# Patient Record
Sex: Male | Born: 1965 | Race: Black or African American | Hispanic: No | Marital: Single | Smoking: Current some day smoker
Health system: Southern US, Community
[De-identification: ages and names within clinical notes are randomized; demographics above are authoritative.]

## PROBLEM LIST (undated history)

## (undated) DIAGNOSIS — I10 Essential (primary) hypertension: Secondary | ICD-10-CM

---

## 1999-10-05 HISTORY — PX: KIDNEY DONATION: SHX685

## 2017-02-14 ENCOUNTER — Emergency Department (HOSPITAL_BASED_OUTPATIENT_CLINIC_OR_DEPARTMENT_OTHER): Payer: 59

## 2017-02-14 ENCOUNTER — Encounter (HOSPITAL_BASED_OUTPATIENT_CLINIC_OR_DEPARTMENT_OTHER): Payer: Self-pay | Admitting: Emergency Medicine

## 2017-02-14 ENCOUNTER — Emergency Department (HOSPITAL_BASED_OUTPATIENT_CLINIC_OR_DEPARTMENT_OTHER)
Admission: EM | Admit: 2017-02-14 | Discharge: 2017-02-14 | Disposition: A | Discharge status: Home / Self Care | Payer: 59 | Attending: Emergency Medicine | Admitting: Emergency Medicine

## 2017-02-14 DIAGNOSIS — F17290 Nicotine dependence, other tobacco product, uncomplicated: Secondary | ICD-10-CM | POA: Insufficient documentation

## 2017-02-14 DIAGNOSIS — Y929 Unspecified place or not applicable: Secondary | ICD-10-CM | POA: Insufficient documentation

## 2017-02-14 DIAGNOSIS — Y999 Unspecified external cause status: Secondary | ICD-10-CM | POA: Diagnosis not present

## 2017-02-14 DIAGNOSIS — S6991XA Unspecified injury of right wrist, hand and finger(s), initial encounter: Secondary | ICD-10-CM | POA: Diagnosis present

## 2017-02-14 DIAGNOSIS — S62656A Nondisplaced fracture of medial phalanx of right little finger, initial encounter for closed fracture: Secondary | ICD-10-CM | POA: Insufficient documentation

## 2017-02-14 DIAGNOSIS — Y939 Activity, unspecified: Secondary | ICD-10-CM | POA: Insufficient documentation

## 2017-02-14 DIAGNOSIS — W231XXA Caught, crushed, jammed, or pinched between stationary objects, initial encounter: Secondary | ICD-10-CM | POA: Insufficient documentation

## 2017-02-14 NOTE — ED Notes (Signed)
Patient states that he fell yesterday and hurt his right pinky finger. The patient has noted swelling to his 2nd and third joints.

## 2017-02-14 NOTE — ED Provider Notes (Signed)
[facility]   CSN: [id number] Arrival date & time: 02/14/17  1820  By signing my name below, I, Andres Nguyen, attest that this documentation has been prepared under the direction and in the presence of physician practitioner, Benjiman Core, MD. Electronically Signed: Linna Nguyen, Scribe. 02/14/2017. 6:39 PM.  History   Chief Complaint Chief Complaint  Patient presents with  . Finger Injury   The history is provided by the patient. No language interpreter was used.    HPI Comments: Andres Nguyen is a 51 y.o. male who presents to the Emergency Department complaining of a right pinky finger injury sustained yesterday. He suffered a mechanical fall yesterday and "jammed" his right pinky finger on the ground. No head trauma or LOC. Patient reports significant pain and swelling to his right pinky finger. No medications or treatments tried. No h/o injuries to his right pinky finger or right hand. He denies numbness/tingling, color change, wounds, or any other associated symptoms.  History reviewed. No pertinent past medical history.  There are no active problems to display for this patient.   History reviewed. No pertinent surgical history.     Home Medications    Prior to Admission medications   Not on File    Family History History reviewed. No pertinent family history.  Social History Social History  Substance Use Topics  . Smoking status: Current Some Day Smoker    Types: Cigars  . Smokeless tobacco: Never Used  . Alcohol use No     Allergies   Patient has no known allergies.   Review of Systems Review of Systems  Musculoskeletal: Positive for arthralgias and joint swelling.  Skin: Negative for color change and wound.  Neurological: Negative for syncope and numbness.   Physical Exam Updated Vital Signs BP (!) 157/90 (BP Location: Left Arm)   Pulse 67   Temp 98.4 F (36.9 C) (Oral)   Resp 18   Ht 6' (1.829 m)   Wt 175 lb  (79.4 kg)   SpO2 99%   BMI 23.73 kg/m   Physical Exam  Constitutional: He is oriented to person, place, and time. He appears well-developed and well-nourished. No distress.  HENT:  Head: Normocephalic and atraumatic.  Neck: Neck supple. No tracheal deviation present.  Pulmonary/Chest: No respiratory distress.  Musculoskeletal: Normal range of motion. He exhibits tenderness.  Right hand, 5th digit: Tenderness and swelling over the PIP joint. No tenderness over DIP or MCP joints. Good movement of MCP and DIP joints. Skin is intact.  Neurological: He is alert and oriented to person, place, and time.  Psychiatric: He has a normal mood and affect. His behavior is normal.  Nursing note and vitals reviewed.  ED Treatments / Results  Labs (all labs ordered are listed, but only abnormal results are displayed) Labs Reviewed - No data to display  EKG  EKG Interpretation None       Radiology Dg Finger Little Right  Result Date: 02/14/2017 CLINICAL DATA:  Fall with pain and injury to the PIP joint EXAM: RIGHT LITTLE FINGER 2+V COMPARISON:  None. FINDINGS: No subluxation. Mild degenerative changes at the DIP and PIP joints. Soft tissue swelling at the PIP joint. Questionable lucency along the volar aspect of the base of the fifth middle phalanx. IMPRESSION: Possible linear lucency/fracture along the volar aspect of the base of the fifth middle phalanx. Electronically Signed   By: Jasmine Pang M.D.   On: 02/14/2017 18:49    Procedures Procedures (including critical care  time)  DIAGNOSTIC STUDIES: Oxygen Saturation is 100% on RA, normal by my interpretation.    COORDINATION OF CARE: 6:36 PM Discussed treatment plan with pt at bedside and pt agreed to plan.  Medications Ordered in ED Medications - No data to display   Initial Impression / Assessment and Plan / ED Course  I have reviewed the triage vital signs and the nursing notes.  Pertinent labs & imaging results that were  available during my care of the patient were reviewed by me and considered in my medical decision making (see chart for details).     Patient with finger injury. Small fracture. Splinted. Discharge to follow-up with sports medicine.  Final Clinical Impressions(s) / ED Diagnoses   Final diagnoses:  Closed nondisplaced fracture of middle phalanx of right little finger, initial encounter    New Prescriptions There are no discharge medications for this patient.  I personally performed the services described in this documentation, which was scribed in my presence. The recorded information has been reviewed and is accurate.      Benjiman CorePickering, Dominque Marlin, MD 02/14/17 2340

## 2020-08-30 ENCOUNTER — Emergency Department (HOSPITAL_BASED_OUTPATIENT_CLINIC_OR_DEPARTMENT_OTHER)
Admission: EM | Admit: 2020-08-30 | Discharge: 2020-08-30 | Disposition: A | Discharge status: Home / Self Care | Payer: 59 | Attending: Emergency Medicine | Admitting: Emergency Medicine

## 2020-08-30 ENCOUNTER — Other Ambulatory Visit: Payer: Self-pay

## 2020-08-30 ENCOUNTER — Encounter (HOSPITAL_BASED_OUTPATIENT_CLINIC_OR_DEPARTMENT_OTHER): Payer: Self-pay

## 2020-08-30 DIAGNOSIS — L02414 Cutaneous abscess of left upper limb: Secondary | ICD-10-CM | POA: Insufficient documentation

## 2020-08-30 DIAGNOSIS — I10 Essential (primary) hypertension: Secondary | ICD-10-CM | POA: Diagnosis not present

## 2020-08-30 DIAGNOSIS — F17290 Nicotine dependence, other tobacco product, uncomplicated: Secondary | ICD-10-CM | POA: Diagnosis not present

## 2020-08-30 DIAGNOSIS — L0291 Cutaneous abscess, unspecified: Secondary | ICD-10-CM

## 2020-08-30 DIAGNOSIS — R2232 Localized swelling, mass and lump, left upper limb: Secondary | ICD-10-CM | POA: Diagnosis present

## 2020-08-30 HISTORY — DX: Essential (primary) hypertension: I10

## 2020-08-30 MED ORDER — SULFAMETHOXAZOLE-TRIMETHOPRIM 800-160 MG PO TABS: 1.0000 | ORAL_TABLET | Freq: Two times a day (BID) | ORAL | 0 refills | Status: AC

## 2020-08-30 MED ORDER — SULFAMETHOXAZOLE-TRIMETHOPRIM 800-160 MG PO TABS
1.0000 | ORAL_TABLET | Freq: Once | ORAL | Status: AC
  Administered 2020-08-30: 14:00:00 1 via ORAL
  Filled 2020-08-30: qty 1

## 2020-08-30 MED ORDER — LIDOCAINE-EPINEPHRINE (PF) 2 %-1:200000 IJ SOLN
10.0000 mL | Freq: Once | INTRAMUSCULAR | Status: AC
  Administered 2020-08-30: 10 mL
  Filled 2020-08-30: qty 20

## 2020-08-30 NOTE — ED Provider Notes (Signed)
[facility] EMERGENCY DEPARTMENT Provider Note   CSN: [id number] Arrival date & time: 08/30/20  1201     History Chief Complaint  Patient presents with   Wound Check    Andres Nguyen is a 54 y.o. male.  Pt presents to the ED today with a wound to his left forearm.  He said he fell outside on Monday, 11/22 and scraped his left arm on the cement.  He said his wound is getting more swollen and red.        Past Medical History:  Diagnosis Date   Hypertension     There are no problems to display for this patient.   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 No pertinent family history.  Social History   Tobacco Use   Smoking status: Current Some Day Smoker    Types: Cigars   Smokeless tobacco: Never Used  Substance Use Topics   Alcohol use: Yes    Comment: ocassional   Drug use: Yes    Types: Marijuana    Home Medications Prior to Admission medications   Medication Sig Start Date End Date Taking? Authorizing Provider  sulfamethoxazole-trimethoprim (BACTRIM DS) 800-160 MG tablet Take 1 tablet by mouth 2 (two) times daily for 7 days. 08/30/20 09/06/20  Jacalyn Lefevre, MD    Allergies    Patient has no known allergies.  Review of Systems   Review of Systems  Skin: Positive for wound.  All other systems reviewed and are negative.   Physical Exam Updated Vital Signs BP (!) 156/86 (BP Location: Right Arm)    Pulse 75    Temp 98.7 F (37.1 C) (Oral)    Resp 18    Ht 6' 6.5" (1.994 m)    Wt 74.8 kg    SpO2 99%    BMI 18.83 kg/m   Physical Exam Vitals and nursing note reviewed.  Constitutional:      Appearance: Normal appearance.  HENT:     Head: Normocephalic and atraumatic.     Right Ear: External ear normal.     Left Ear: External ear normal.     Nose: Nose normal.     Mouth/Throat:     Mouth: Mucous membranes are moist.      Pharynx: Oropharynx is clear.  Eyes:     Extraocular Movements: Extraocular movements intact.     Conjunctiva/sclera: Conjunctivae normal.     Pupils: Pupils are equal, round, and reactive to light.  Cardiovascular:     Rate and Rhythm: Normal rate and regular rhythm.     Pulses: Normal pulses.     Heart sounds: Normal heart sounds.  Pulmonary:     Effort: Pulmonary effort is normal.     Breath sounds: Normal breath sounds.  Abdominal:     General: Abdomen is flat. Bowel sounds are normal.     Palpations: Abdomen is soft.  Musculoskeletal:        General: Normal range of motion.     Cervical back: Normal range of motion and neck supple.  Skin:    General: Skin is warm.     Capillary Refill: Capillary refill takes less than 2 seconds.     Comments: Quarter sized necrotic area with abscess to left forearm  Neurological:     General: No focal deficit present.     Mental Status: He is alert and oriented  to person, place, and time.  Psychiatric:        Mood and Affect: Mood normal.        Behavior: Behavior normal.     ED Results / Procedures / Treatments   Labs (all labs ordered are listed, but only abnormal results are displayed) Labs Reviewed - No data to display  EKG None  Radiology No results found.  Procedures .Marland KitchenIncision and Drainage  Date/Time: 08/30/2020 1:16 PM Performed by: Jacalyn Lefevre, MD Authorized by: Jacalyn Lefevre, MD   Consent:    Consent obtained:  Verbal   Consent given by:  Patient   Risks discussed:  Bleeding, incomplete drainage and pain   Alternatives discussed:  No treatment Location:    Type:  Abscess   Size:  3   Location:  Upper extremity   Upper extremity location:  Arm   Arm location:  L lower arm Pre-procedure details:    Skin preparation:  Betadine Anesthesia (see MAR for exact dosages):    Anesthesia method:  Local infiltration   Local anesthetic:  Lidocaine 2% WITH epi Procedure type:    Complexity:  Simple Procedure  details:    Incision types:  Single straight   Scalpel blade:  11   Wound management:  Probed and deloculated   Drainage:  Purulent   Drainage amount:  Copious   Wound treatment:  Wound left open   Packing materials:  None Post-procedure details:    Patient tolerance of procedure:  Tolerated well, no immediate complications Comments:     Necrotic area opened up and deroofed.  Area under necrotic area probed and deloculated.   (including critical care time)  Medications Ordered in ED Medications  sulfamethoxazole-trimethoprim (BACTRIM DS) 800-160 MG per tablet 1 tablet (has no administration in time range)  lidocaine-EPINEPHrine (XYLOCAINE W/EPI) 2 %-1:200000 (PF) injection 10 mL (10 mLs Infiltration Given by Other 08/30/20 1254)    ED Course  I have reviewed the triage vital signs and the nursing notes.  Pertinent labs & imaging results that were available during my care of the patient were reviewed by me and considered in my medical decision making (see chart for details).    MDM Rules/Calculators/A&P                          Pt will be started on bactrim.  He is to return if worse.  F/u with pcp. Final Clinical Impression(s) / ED Diagnoses Final diagnoses:  Abscess    Rx / DC Orders ED Discharge Orders         Ordered    sulfamethoxazole-trimethoprim (BACTRIM DS) 800-160 MG tablet  2 times daily        08/30/20 1314           Jacalyn Lefevre, MD 08/30/20 1318

## 2020-08-30 NOTE — ED Triage Notes (Signed)
Pt arrives stating he fell outside on Monday and cut his left elbow on the cement. States over the past few days he has had discharge, swelling and redness to elbow. States he has been feeling hot and cold.

## 2021-01-21 ENCOUNTER — Emergency Department (HOSPITAL_BASED_OUTPATIENT_CLINIC_OR_DEPARTMENT_OTHER)
Admission: EM | Admit: 2021-01-21 | Discharge: 2021-01-21 | Disposition: A | Discharge status: Home / Self Care | Payer: 59 | Attending: Emergency Medicine | Admitting: Emergency Medicine

## 2021-01-21 ENCOUNTER — Emergency Department (HOSPITAL_BASED_OUTPATIENT_CLINIC_OR_DEPARTMENT_OTHER): Payer: 59

## 2021-01-21 ENCOUNTER — Other Ambulatory Visit: Payer: Self-pay

## 2021-01-21 ENCOUNTER — Encounter (HOSPITAL_BASED_OUTPATIENT_CLINIC_OR_DEPARTMENT_OTHER): Payer: Self-pay

## 2021-01-21 DIAGNOSIS — I10 Essential (primary) hypertension: Secondary | ICD-10-CM | POA: Insufficient documentation

## 2021-01-21 DIAGNOSIS — S3992XA Unspecified injury of lower back, initial encounter: Secondary | ICD-10-CM | POA: Diagnosis present

## 2021-01-21 DIAGNOSIS — X500XXA Overexertion from strenuous movement or load, initial encounter: Secondary | ICD-10-CM | POA: Insufficient documentation

## 2021-01-21 DIAGNOSIS — Y9389 Activity, other specified: Secondary | ICD-10-CM | POA: Insufficient documentation

## 2021-01-21 DIAGNOSIS — S39012A Strain of muscle, fascia and tendon of lower back, initial encounter: Secondary | ICD-10-CM | POA: Insufficient documentation

## 2021-01-21 DIAGNOSIS — F17290 Nicotine dependence, other tobacco product, uncomplicated: Secondary | ICD-10-CM | POA: Insufficient documentation

## 2021-01-21 MED ORDER — CYCLOBENZAPRINE HCL 10 MG PO TABS: 10.0000 mg | ORAL_TABLET | Freq: Two times a day (BID) | ORAL | 0 refills | Status: AC | PRN

## 2021-01-21 NOTE — ED Provider Notes (Signed)
[facility] EMERGENCY DEPARTMENT Provider Note   CSN: [id number] Arrival date & time: 01/21/21  0175     History Chief Complaint  Patient presents with  . Back Injury    Andres Nguyen is a 55 y.o. male.  Presents to ER with concern for back pain.  Patient reports history while he was moving furniture, corner hit him in his lower back.  Noted slight area of swelling.  Has been able to walk without difficulty.  Denies any other injuries.  Pain is currently mild to moderate.  Central lower.  Nonradiating.  No numbness or weakness.  HPI     Past Medical History:  Diagnosis Date  . Hypertension     There are no problems to display for this patient.   Past Surgical History:  Procedure Laterality Date  . 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 No pertinent family history.  Social History   Tobacco Use  . Smoking status: Current Some Day Smoker    Types: Cigars  . Smokeless tobacco: Never Used  Substance Use Topics  . Alcohol use: Yes    Comment: ocassional  . Drug use: Yes    Types: Marijuana    Home Medications Prior to Admission medications   Medication Sig Start Date End Date Taking? Authorizing Provider  cyclobenzaprine (FLEXERIL) 10 MG tablet Take 1 tablet (10 mg total) by mouth 2 (two) times daily as needed for muscle spasms. 01/21/21  Yes Milagros Loll, MD    Allergies    Patient has no known allergies.  Review of Systems   Review of Systems  Constitutional: Negative for chills and fever.  HENT: Negative for ear pain and sore throat.   Eyes: Negative for pain and visual disturbance.  Respiratory: Negative for cough and shortness of breath.   Cardiovascular: Negative for chest pain and palpitations.  Gastrointestinal: Negative for abdominal pain and vomiting.  Genitourinary: Negative for dysuria and hematuria.  Musculoskeletal: Positive for arthralgias and back pain.  Skin: Negative for  color change and rash.  Neurological: Negative for seizures and syncope.  All other systems reviewed and are negative.   Physical Exam Updated Vital Signs BP (!) 157/95 (BP Location: Right Arm)   Pulse (!) 57   Temp 98 F (36.7 C) (Oral)   Resp 18   Ht 6\' 6"  (1.981 m)   Wt 74.8 kg   SpO2 99%   BMI 19.07 kg/m   Physical Exam Vitals and nursing note reviewed.  Constitutional:      Appearance: He is well-developed.  HENT:     Head: Normocephalic and atraumatic.  Eyes:     Conjunctiva/sclera: Conjunctivae normal.  Cardiovascular:     Rate and Rhythm: Normal rate and regular rhythm.     Heart sounds: No murmur heard.   Pulmonary:     Effort: Pulmonary effort is normal.  Abdominal:     Palpations: Abdomen is soft.     Tenderness: There is no abdominal tenderness.  Musculoskeletal:     Cervical back: Neck supple.     Comments: Some tenderness noted over the lumbar region, no deformity noted, no step-off  Skin:    General: Skin is warm and dry.  Neurological:     General: No focal deficit present.     Mental Status: He is alert.  Psychiatric:        Mood and Affect: Mood normal.     ED  Results / Procedures / Treatments   Labs (all labs ordered are listed, but only abnormal results are displayed) Labs Reviewed - No data to display  EKG None  Radiology DG Lumbar Spine Complete  Result Date: 01/21/2021 CLINICAL DATA:  55 year old male with history of back pain after a fall. EXAM: LUMBAR SPINE - COMPLETE 4+ VIEW COMPARISON:  No priors. FINDINGS: Five views of the lumbar spine demonstrate no acute displaced fracture. Alignment is anatomic. No defects of the pars interarticularis are noted. Disc space narrowing at L5-S1 with discogenic changes in the adjacent vertebral body endplates. Multilevel facet arthropathy, most pronounced at L5-S1. IMPRESSION: 1. No acute radiographic abnormality of the lumbar spine. 2. Multilevel degenerative disc disease and lumbar spondylosis,  most pronounced at L5-S1. Electronically Signed   By: Trudie Reed M.D.   On: 01/21/2021 07:57    Procedures Procedures   Medications Ordered in ED Medications - No data to display  ED Course  I have reviewed the triage vital signs and the nursing notes.  Pertinent labs & imaging results that were available during my care of the patient were reviewed by me and considered in my medical decision making (see chart for details).    MDM Rules/Calculators/A&P                          55 year old male presents to ER with concern for low back pain after injury.  X-ray negative.  He is well-appearing, no other trauma identified on exam.  Discharged home.   After the discussed management above, the patient was determined to be safe for discharge.  The patient was in agreement with this plan and all questions regarding their care were answered.  ED return precautions were discussed and the patient will return to the ED with any significant worsening of condition.   Final Clinical Impression(s) / ED Diagnoses Final diagnoses:  Strain of lumbar region, initial encounter    Rx / DC Orders ED Discharge Orders         Ordered    cyclobenzaprine (FLEXERIL) 10 MG tablet  2 times daily PRN        01/21/21 0817           Milagros Loll, MD 01/21/21 1041

## 2021-01-21 NOTE — Discharge Instructions (Signed)
Take Tylenol or Motrin for pain control.  Can also take the prescribed muscle relaxer as needed.  Note this can make you drowsy and should not be taken while driving or operating heavy machinery.  Follow-up with your primary doctor.

## 2021-01-21 NOTE — ED Triage Notes (Signed)
Pt states approx 9pm last night he hit the corner of the wall on his lower back and a knot appeared. States the swelling has gone down but the area is still tender. Ambulatory to room. No acute distress noted.

## 2021-06-28 ENCOUNTER — Emergency Department (HOSPITAL_BASED_OUTPATIENT_CLINIC_OR_DEPARTMENT_OTHER): Payer: BC Managed Care – PPO

## 2021-06-28 ENCOUNTER — Other Ambulatory Visit: Payer: Self-pay

## 2021-06-28 ENCOUNTER — Emergency Department (HOSPITAL_BASED_OUTPATIENT_CLINIC_OR_DEPARTMENT_OTHER)
Admission: EM | Admit: 2021-06-28 | Discharge: 2021-06-28 | Disposition: A | Discharge status: Home / Self Care | Payer: BC Managed Care – PPO | Attending: Emergency Medicine | Admitting: Emergency Medicine

## 2021-06-28 ENCOUNTER — Encounter (HOSPITAL_BASED_OUTPATIENT_CLINIC_OR_DEPARTMENT_OTHER): Payer: Self-pay | Admitting: Emergency Medicine

## 2021-06-28 DIAGNOSIS — R7309 Other abnormal glucose: Secondary | ICD-10-CM | POA: Insufficient documentation

## 2021-06-28 DIAGNOSIS — L03012 Cellulitis of left finger: Secondary | ICD-10-CM | POA: Diagnosis not present

## 2021-06-28 DIAGNOSIS — I10 Essential (primary) hypertension: Secondary | ICD-10-CM | POA: Insufficient documentation

## 2021-06-28 DIAGNOSIS — M79645 Pain in left finger(s): Secondary | ICD-10-CM | POA: Diagnosis present

## 2021-06-28 DIAGNOSIS — F17290 Nicotine dependence, other tobacco product, uncomplicated: Secondary | ICD-10-CM | POA: Diagnosis not present

## 2021-06-28 LAB — CBG MONITORING, ED: Glucose-Capillary: 103 mg/dL — ABNORMAL HIGH (ref 70–99)

## 2021-06-28 MED ORDER — LIDOCAINE HCL 2 % IJ SOLN: 10.0000 mL | Freq: Once | INTRAMUSCULAR | Status: DC

## 2021-06-28 MED ORDER — DOXYCYCLINE HYCLATE 100 MG PO TABS
100.0000 mg | ORAL_TABLET | Freq: Once | ORAL | Status: AC
  Administered 2021-06-28: 100 mg via ORAL
  Filled 2021-06-28: qty 1

## 2021-06-28 MED ORDER — BUPIVACAINE HCL (PF) 0.5 % IJ SOLN
10.0000 mL | Freq: Once | INTRAMUSCULAR | Status: AC
  Administered 2021-06-28: 10 mL
  Filled 2021-06-28: qty 10

## 2021-06-28 MED ORDER — DOXYCYCLINE HYCLATE 100 MG PO CAPS: 100.0000 mg | ORAL_CAPSULE | Freq: Two times a day (BID) | ORAL | 0 refills | Status: AC

## 2021-06-28 NOTE — Discharge Instructions (Addendum)
I prescribed you an antibiotic called doxycycline.  Please take this twice a day for the next 10 days.  Do not stop taking this medication early even if you feel your symptoms have resolved.  Below is the contact information for Dr. Aundria Rud.  He is a Haematologist.  Please give them a call and schedule a follow-up appointment for your hand.  If you cannot get an appointment soon with him please return to the emergency department in 48 hours for a recheck of your finger.  If you develop any new or worsening symptoms please come back to the emergency department immediately.  It was a pleasure to meet you.

## 2021-06-28 NOTE — ED Triage Notes (Signed)
Reports injuring the left hand while moving some furniture.  Happened a few days ago.

## 2021-06-28 NOTE — ED Provider Notes (Signed)
[facility] EMERGENCY DEPARTMENT Provider Note   CSN: [id number] Arrival date & time: 06/28/21  2114     History Chief Complaint  Patient presents with   Hand Injury    Andres Nguyen is a 55 y.o. male.  HPI Patient is a 55 year old right-hand-dominant male who presents to the emergency department due to left third digit pain and swelling.  Patient states that he struck the end of the finger on a piece of furniture while moving it a few days ago.  He is continued to develop pain and swelling along the distal tip of the digit circumferentially.  Denies any nausea, vomiting, fevers, chills.    Past Medical History:  Diagnosis Date   Hypertension     There are no problems to display for this patient.   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  Social History   Tobacco Use   Smoking status: Some Days    Types: Cigars   Smokeless tobacco: Never  Substance Use Topics   Alcohol use: Yes    Comment: ocassional   Drug use: Yes    Types: Marijuana    Home Medications Prior to Admission medications   Medication Sig Start Date End Date Taking? Authorizing Provider  doxycycline (VIBRAMYCIN) 100 MG capsule Take 1 capsule (100 mg total) by mouth 2 (two) times daily for 10 days. 06/28/21 07/08/21 Yes Placido Sou, PA-C  cyclobenzaprine (FLEXERIL) 10 MG tablet Take 1 tablet (10 mg total) by mouth 2 (two) times daily as needed for muscle spasms. 01/21/21   Milagros Loll, MD    Allergies    Patient has no known allergies.  Review of Systems   Review of Systems  Constitutional:  Negative for chills and fever.  Gastrointestinal:  Negative for nausea and vomiting.  Musculoskeletal:  Positive for arthralgias and joint swelling.  Skin:  Negative for wound.   Physical Exam Updated Vital Signs BP (!) 144/91 (BP Location: Right Arm)   Pulse 61   Temp  98.1 F (36.7 C) (Oral)   Resp 16   Ht 6' 6.5" (1.994 m)   Wt 72.1 kg   SpO2 98%   BMI 18.14 kg/m   Physical Exam Vitals and nursing note reviewed.  Constitutional:      General: He is not in acute distress.    Appearance: He is well-developed.  HENT:     Head: Normocephalic and atraumatic.     Right Ear: External ear normal.     Left Ear: External ear normal.  Eyes:     General: No scleral icterus.       Right eye: No discharge.        Left eye: No discharge.     Conjunctiva/sclera: Conjunctivae normal.  Neck:     Trachea: No tracheal deviation.  Cardiovascular:     Rate and Rhythm: Normal rate.  Pulmonary:     Effort: Pulmonary effort is normal. No respiratory distress.     Breath sounds: No stridor.  Abdominal:     General: There is no distension.  Musculoskeletal:        General: Swelling and tenderness present. No deformity.     Cervical back: Neck supple.     Comments: Moderate TTP and edema noted circumferentially along the distal tip of the left third finger.  Mild tenderness along the palmar aspect of  the MCP of the same finger.  Unable to assess range of motion due to patient's pain.  Distal sensation intact.  2+ radial pulses.  Skin:    General: Skin is warm and dry.     Findings: No rash.  Neurological:     Mental Status: He is alert.     Cranial Nerves: Cranial nerve deficit: no gross deficits.   ED Results / Procedures / Treatments   Labs (all labs ordered are listed, but only abnormal results are displayed) Labs Reviewed  CBG MONITORING, ED - Abnormal; Notable for the following components:      Result Value   Glucose-Capillary 103 (*)    All other components within normal limits    EKG None  Radiology DG Hand Complete Left  Result Date: 06/28/2021 CLINICAL DATA:  left middle finger pain and swelling; possible infection. Reports injuring the left hand while moving some furniture. Happened a few days ago. Per PA, no report of index finger  injury. EXAM: LEFT HAND - COMPLETE 3+ VIEW COMPARISON:  X-ray left thumb 09/29/2018, left hand 02/24/2018 FINDINGS: There is no evidence of fracture or dislocation. No cortical erosion or destruction. Cortical irregularity of the fourth digit tuft likely representing an old healed fracture that is new from 2019. Degenerative changes of the first carpometacarpal joint. Mild subcutaneus soft tissue edema of the distal third digit. No retained radiopaque foreign body. IMPRESSION: 1. Mild subcutaneus soft tissue edema of the distal third digit with no radiographic findings to suggest osteomyelitis. 2.  No acute displaced fracture or dislocation. Electronically Signed   By: Tish Frederickson M.D.   On: 06/28/2021 22:28    Procedures Drain paronychia  Date/Time: 06/28/2021 11:49 PM Performed by: Placido Sou, PA-C Authorized by: Placido Sou, PA-C  Consent: Verbal consent obtained. Written consent obtained. Consent given by: patient Patient understanding: patient states understanding of the procedure being performed Patient consent: the patient's understanding of the procedure matches consent given Procedure consent: procedure consent matches procedure scheduled Relevant documents: relevant documents present and verified Test results: test results available and properly labeled Site marked: the operative site was marked Imaging studies: imaging studies available Required items: required blood products, implants, devices, and special equipment available Patient identity confirmed: verbally with patient Time out: Immediately prior to procedure a "time out" was called to verify the correct patient, procedure, equipment, support staff and site/side marked as required. Preparation: Patient was prepped and draped in the usual sterile fashion. Local anesthesia used: yes Anesthesia: digital block  Anesthesia: Local anesthesia used: yes Local Anesthetic: bupivacaine 0.5% without  epinephrine Anesthetic total: 3 mL  Sedation: Patient sedated: no  Patient tolerance: patient tolerated the procedure well with no immediate complications    Medications Ordered in ED Medications  bupivacaine (MARCAINE) 0.5 % injection 10 mL (10 mLs Infiltration Given by Other 06/28/21 2323)  doxycycline (VIBRA-TABS) tablet 100 mg (100 mg Oral Given 06/28/21 2332)    ED Course  I have reviewed the triage vital signs and the nursing notes.  Pertinent labs & imaging results that were available during my care of the patient were reviewed by me and considered in my medical decision making (see chart for details).    MDM Rules/Calculators/A&P                          Pt is a 55 y.o. male who presents to the emergency department with pain and swelling along the distal aspect of the left  middle finger after it was crushed between 2 pieces of furniture.  Labs: CBG of 103.  Imaging: X-ray of the left hand shows mild subcutaneous soft tissue edema of the distal third digit with no radiographic findings to suggest osteomyelitis.  No acute displaced fracture or dislocation.  I, Placido Sou, PA-C, personally reviewed and evaluated these images and lab results as part of my medical decision-making.  Exam initially concerning for developing paronychia versus hematoma.  Finger was digitally blocked successfully.  Small incision was made producing amount 4 cc of purulent discharge.  Significant improvement in patient's edema.  Patient reported no pain during the procedure.  Will give patient a referral to hand surgery.  We will prescribe him a course of doxycycline.  First dose given in the emergency department.  Recommended wound recheck in 48 hours if he is unable to get an appointment with hand surgery in a timely manner.  Feel the patient is stable for discharge at this time and he is agreeable.  We discussed return precautions at length.  Patient verbalized understanding of the above  plan.  His questions were answered and he was amicable at the time of discharge.  Note: Portions of this report may have been transcribed using voice recognition software. Every effort was made to ensure accuracy; however, inadvertent computerized transcription errors may be present.   Final Clinical Impression(s) / ED Diagnoses Final diagnoses:  Paronychia of finger of left hand   Rx / DC Orders ED Discharge Orders          Ordered    doxycycline (VIBRAMYCIN) 100 MG capsule  2 times daily        06/28/21 2328             Placido Sou, PA-C 06/28/21 2353    Charlynne Pander, MD 07/01/21 [phone]

## 2021-09-05 ENCOUNTER — Emergency Department (HOSPITAL_BASED_OUTPATIENT_CLINIC_OR_DEPARTMENT_OTHER)
Admission: EM | Admit: 2021-09-05 | Discharge: 2021-09-05 | Disposition: A | Discharge status: Home / Self Care | Payer: BC Managed Care – PPO | Attending: Emergency Medicine | Admitting: Emergency Medicine

## 2021-09-05 ENCOUNTER — Encounter (HOSPITAL_BASED_OUTPATIENT_CLINIC_OR_DEPARTMENT_OTHER): Payer: Self-pay

## 2021-09-05 ENCOUNTER — Emergency Department (HOSPITAL_BASED_OUTPATIENT_CLINIC_OR_DEPARTMENT_OTHER): Payer: BC Managed Care – PPO

## 2021-09-05 ENCOUNTER — Other Ambulatory Visit: Payer: Self-pay

## 2021-09-05 DIAGNOSIS — I10 Essential (primary) hypertension: Secondary | ICD-10-CM | POA: Diagnosis not present

## 2021-09-05 DIAGNOSIS — S8000XA Contusion of unspecified knee, initial encounter: Secondary | ICD-10-CM

## 2021-09-05 DIAGNOSIS — W0110XA Fall on same level from slipping, tripping and stumbling with subsequent striking against unspecified object, initial encounter: Secondary | ICD-10-CM | POA: Diagnosis not present

## 2021-09-05 DIAGNOSIS — S8002XA Contusion of left knee, initial encounter: Secondary | ICD-10-CM | POA: Insufficient documentation

## 2021-09-05 DIAGNOSIS — S8992XA Unspecified injury of left lower leg, initial encounter: Secondary | ICD-10-CM | POA: Diagnosis present

## 2021-09-05 DIAGNOSIS — F17210 Nicotine dependence, cigarettes, uncomplicated: Secondary | ICD-10-CM | POA: Insufficient documentation

## 2021-09-05 DIAGNOSIS — M25561 Pain in right knee: Secondary | ICD-10-CM | POA: Diagnosis not present

## 2021-09-05 MED ORDER — KETOROLAC TROMETHAMINE 60 MG/2ML IM SOLN
60.0000 mg | Freq: Once | INTRAMUSCULAR | Status: AC
  Administered 2021-09-05: 60 mg via INTRAMUSCULAR
  Filled 2021-09-05: qty 2

## 2021-09-05 NOTE — ED Notes (Signed)
Patient transported to X-ray 

## 2021-09-05 NOTE — ED Triage Notes (Signed)
Pt was carrying a washing machine downstairs on Tuesday and it slipped and fell onto bilateral legs above knees. Now complaining of bilateral knee pain and swelling.

## 2021-09-05 NOTE — ED Provider Notes (Signed)
[facility] EMERGENCY DEPARTMENT Provider Note   CSN: [id number] Arrival date & time: 09/05/21  1205     History Chief Complaint  Patient presents with   Knee Pain    Andres Nguyen is a 55 y.o. male.  The history is provided by the patient.  Knee Pain Location:  Knee Time since incident:  4 days Injury: yes   Mechanism of injury: crush   Knee location:  L knee and R knee Pain details:    Quality:  Aching   Severity:  Mild   Onset quality:  Gradual Chronicity:  New Relieved by:  Acetaminophen Worsened by:  Bearing weight Associated symptoms: swelling   Associated symptoms: no back pain, no decreased ROM, no fatigue, no fever, no itching, no muscle weakness, no neck pain, no numbness, no stiffness and no tingling       Past Medical History:  Diagnosis Date   Hypertension     There are no problems to display for this patient.   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 No pertinent family history.  Social History   Tobacco Use   Smoking status: Some Days    Types: Cigars   Smokeless tobacco: Never  Vaping Use   Vaping Use: Never used  Substance Use Topics   Alcohol use: Yes    Comment: ocassional   Drug use: Yes    Types: Marijuana    Home Medications Prior to Admission medications   Medication Sig Start Date End Date Taking? Authorizing Provider  cyclobenzaprine (FLEXERIL) 10 MG tablet Take 1 tablet (10 mg total) by mouth 2 (two) times daily as needed for muscle spasms. 01/21/21   Milagros Loll, MD    Allergies    Patient has no known allergies.  Review of Systems   Review of Systems  Constitutional:  Negative for fatigue and fever.  Musculoskeletal:  Positive for arthralgias, gait problem and joint swelling. Negative for back pain, myalgias, neck pain, neck stiffness and stiffness.  Skin:  Negative for itching and wound.    Physical Exam Updated Vital Signs  ED Triage Vitals  Enc Vitals Group     BP 09/05/21 1219 128/90     Pulse Rate 09/05/21 1219 61     Resp 09/05/21 1219 16     Temp 09/05/21 1218 98.6 F (37 C)     Temp Source 09/05/21 1218 Oral     SpO2 09/05/21 1219 100 %     Weight 09/05/21 1216 161 lb (73 kg)     Height 09/05/21 1216 6' 6.5" (1.994 m)     Head Circumference --      Peak Flow --      Pain Score 09/05/21 1216 7     Pain Loc --      Pain Edu? --      Excl. in GC? --      Physical Exam Constitutional:      General: He is not in acute distress.    Appearance: He is not ill-appearing.  Eyes:     Pupils: Pupils are equal, round, and reactive to light.  Cardiovascular:     Pulses: Normal pulses.  Musculoskeletal:        General: Swelling (left knee) and tenderness (left and right knee) present. Normal range of motion.  Skin:    General: Skin is warm.  Capillary Refill: Capillary refill takes less than 2 seconds.  Neurological:     General: No focal deficit present.     Mental Status: He is alert.     Sensory: No sensory deficit.     Motor: No weakness.    ED Results / Procedures / Treatments   Labs (all labs ordered are listed, but only abnormal results are displayed) Labs Reviewed - No data to display  EKG None  Radiology No results found.  Procedures Procedures   Medications Ordered in ED Medications  ketorolac (TORADOL) injection 60 mg (60 mg Intramuscular Given 09/05/21 1249)    ED Course  I have reviewed the triage vital signs and the nursing notes.  Pertinent labs & imaging results that were available during my care of the patient were reviewed by me and considered in my medical decision making (see chart for details).    MDM Rules/Calculators/A&P                           Andres Nguyen is here with bilateral knee pain.  Normal vitals.  No fever.  Patient was moving a washing machine several days ago when machine hit both of his knees  hard.  He has some swelling to the left knee.  He has been ambulatory since.  Has been using Tylenol with some improvement.  There is no obvious laxity of his knees.  X-rays negative for fracture.  Some soft tissue swelling around the left knee which I suspect is inflammatory.  He is a prior kidney donor and gave him a shot of Toradol here but recommend that he not take much more ibuprofen otherwise.  Recommend continued use of ice and Tylenol.  Recommend follow-up with primary care doctor.  Neurovascular neuromuscularly intact.  Discharged in good condition.  This chart was dictated using voice recognition software.  Despite best efforts to proofread,  errors can occur which can change the documentation meaning.   Final Clinical Impression(s) / ED Diagnoses Final diagnoses:  Contusion of knee, unspecified laterality, initial encounter    Rx / DC Orders ED Discharge Orders     None        Virgina Norfolk, DO 09/05/21 1303

## 2021-09-05 NOTE — Discharge Instructions (Addendum)
Overall suspect you have contusion/inflammation of the knees from your trauma.  Recommend ice, 1000 mg of Tylenol every 6 hours.  X-rays negative for fractures.  Do not suspect any soft tissue injury but if you are having ongoing pain recommend close follow-up with primary care doctor.

## 2021-09-05 NOTE — ED Notes (Signed)
D/c paperwork reviewed with pt.  Pt with no questions or concerns at time of d/c. Pt reports he prefers to ambulate out of ED. Ambulatory with steady gait to exit.

## 2021-10-12 ENCOUNTER — Other Ambulatory Visit: Payer: Self-pay

## 2021-10-12 ENCOUNTER — Encounter (HOSPITAL_BASED_OUTPATIENT_CLINIC_OR_DEPARTMENT_OTHER): Payer: Self-pay | Admitting: *Deleted

## 2021-10-12 ENCOUNTER — Emergency Department (HOSPITAL_BASED_OUTPATIENT_CLINIC_OR_DEPARTMENT_OTHER)
Admission: EM | Admit: 2021-10-12 | Discharge: 2021-10-12 | Disposition: A | Discharge status: Home / Self Care | Payer: BC Managed Care – PPO | Attending: Emergency Medicine | Admitting: Emergency Medicine

## 2021-10-12 ENCOUNTER — Emergency Department (HOSPITAL_BASED_OUTPATIENT_CLINIC_OR_DEPARTMENT_OTHER): Payer: BC Managed Care – PPO

## 2021-10-12 DIAGNOSIS — S8992XA Unspecified injury of left lower leg, initial encounter: Secondary | ICD-10-CM

## 2021-10-12 DIAGNOSIS — W01198A Fall on same level from slipping, tripping and stumbling with subsequent striking against other object, initial encounter: Secondary | ICD-10-CM | POA: Diagnosis not present

## 2021-10-12 DIAGNOSIS — S80912A Unspecified superficial injury of left knee, initial encounter: Secondary | ICD-10-CM | POA: Diagnosis not present

## 2021-10-12 NOTE — ED Triage Notes (Signed)
He was walking backward and fell yesterday. Injury to his left knee and lower leg. Pt is ambulatory.

## 2021-10-12 NOTE — ED Notes (Signed)
Ambulatory to room without difficulty 

## 2021-10-12 NOTE — ED Provider Notes (Signed)
[facility] EMERGENCY DEPARTMENT Provider Note   CSN: [id number] Arrival date & time: 10/12/21  1616     History  Chief Complaint  Patient presents with   Andres Nguyen is a 56 y.o. male presenting today after a fall that occurred yesterday while playing with his grandson.  He reports that he was running backwards and tripped over a stick and fell onto another stick, injuring his left knee.  He was ambulatory afterwards, denies any numbness.  Reports that he used a heat pack which has been helping him however in the morning he feels as though it the area behind his knee is very tight.  It loosens up some throughout the day.  Has not tried any over-the-counter pain medication.   Home Medications Prior to Admission medications   Medication Sig Start Date End Date Taking? Authorizing Provider  cyclobenzaprine (FLEXERIL) 10 MG tablet Take 1 tablet (10 mg total) by mouth 2 (two) times daily as needed for muscle spasms. 01/21/21   Milagros Loll, MD      Allergies    Patient has no known allergies.    Review of Systems   Review of Systems  Physical Exam Updated Vital Signs BP (!) 148/92 (BP Location: Right Arm)    Pulse (!) 55    Temp 98.4 F (36.9 C) (Oral)    Resp 16    Ht 6' 6.5" (1.994 m)    Wt 73 kg    SpO2 100%    BMI 18.36 kg/m  Physical Exam Vitals and nursing note reviewed.  Constitutional:      Appearance: Normal appearance.  HENT:     Head: Normocephalic and atraumatic.  Eyes:     General: No scleral icterus.    Conjunctiva/sclera: Conjunctivae normal.  Pulmonary:     Effort: Pulmonary effort is normal. No respiratory distress.  Musculoskeletal:        General: Tenderness (To the posterior knee and gastrocs) present. No swelling, deformity or signs of injury. Normal range of motion.     Comments: Strong DP pulse  Skin:    General: Skin is warm and dry.     Findings: No rash.  Neurological:     Mental Status: He is alert.  Psychiatric:         Mood and Affect: Mood normal.    ED Results / Procedures / Treatments   Labs (all labs ordered are listed, but only abnormal results are displayed) Labs Reviewed - No data to display  EKG None  Radiology DG Tibia/Fibula Left  Result Date: 10/12/2021 CLINICAL DATA:  Left leg pain after fall yesterday. EXAM: LEFT TIBIA AND FIBULA - 2 VIEW COMPARISON:  None. FINDINGS: There is no evidence of fracture or other focal bone lesions. Soft tissues are unremarkable. IMPRESSION: Negative. Electronically Signed   By: Lupita Raider M.D.   On: 10/12/2021 16:54   DG Knee Complete 4 Views Left  Result Date: 10/12/2021 CLINICAL DATA:  Left knee pain after fall yesterday. EXAM: LEFT KNEE - COMPLETE 4+ VIEW COMPARISON:  None. FINDINGS: No evidence of fracture, dislocation, or joint effusion. No evidence of arthropathy or other focal bone abnormality. Soft tissues are unremarkable. IMPRESSION: Negative. Electronically Signed   By: Lupita Raider M.D.   On: 10/12/2021 16:52    Procedures Procedures   Medications Ordered in ED Medications - No data to display  ED Course/ Medical Decision Making/ A&P  Medical Decision Making  56 year old male presenting after falling down while playing with his grandson.  Complained of injury to his posterior knee and calf of his left lower extremity.  X-rays are negative.  We discussed that x-rays only see bones and not if his symptoms persisted it may be a good idea to follow-up with sports medicine for a potential MRI.  I have low suspicion for ligamental or meniscal injury however referral to sports medicine has been placed.  He will treat his pain with over-the-counter Tylenol due to his solitary kidney status.  He may also continue to utilize his heating pack.  He is agreeable to discharge at this time.  Final Clinical Impression(s) / ED Diagnoses Final diagnoses:  Injury of left knee, initial encounter    Rx / DC Orders Results  and diagnoses were explained to the patient. Return precautions discussed in full. Patient had no additional questions and expressed complete understanding.   This chart was dictated using voice recognition software.  Despite best efforts to proofread,  errors can occur which can change the documentation meaning.    Saddie Benders, PA-C 10/12/21 1803    Gwyneth Sprout, MD 10/16/21 [phone]

## 2021-10-15 ENCOUNTER — Ambulatory Visit: Payer: Self-pay

## 2021-10-15 ENCOUNTER — Encounter: Payer: Self-pay | Admitting: Family Medicine

## 2021-10-15 ENCOUNTER — Ambulatory Visit: Payer: BC Managed Care – PPO | Admitting: Family Medicine

## 2021-10-15 VITALS — BP 118/86 | Ht 78.5 in | Wt 160.0 lb

## 2021-10-15 DIAGNOSIS — M25562 Pain in left knee: Secondary | ICD-10-CM

## 2021-10-15 DIAGNOSIS — M25462 Effusion, left knee: Secondary | ICD-10-CM | POA: Diagnosis not present

## 2021-10-15 MED ORDER — PREDNISONE 5 MG PO TABS: ORAL_TABLET | ORAL | 0 refills | Status: AC

## 2021-10-15 NOTE — Progress Notes (Signed)
°  Andres Nguyen - 56 y.o. male MRN [id number]  Date of birth: [birth year]  SUBJECTIVE:  Including CC & ROS.  No chief complaint on file.   Andres Nguyen is a 56 y.o. male that is presenting with acute left knee pain.  The pain is severe in nature.  He has limited range of motion.  No previous history of any pain.  Independent review of the left knee x-ray from 1/9 shows no acute changes.  Independent review of the left tibia and fibula x-ray shows no acute changes. Review of the emergency department note from 1/9 shows he was counseled on over-the-counter medications.  Review of Systems See HPI   HISTORY: Past Medical, Surgical, Social, and Family History Reviewed & Updated per EMR.   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 Normal-appearing quadricep and patellar tendon. Degenerative changes appreciated of the meniscus with increased hyperemia. Large Baker's cyst  Summary: Effusion and Baker's cyst  Ultrasound and interpretation by Clearance Coots, MD    ASSESSMENT & PLAN:   Knee effusion, left Had an acute injury a few days ago.  Has a fairly large effusion that is likely related to his meniscal changes. -Counseled on home exercise therapy and supportive care. -Prednisone. -Could consider aspiration injection

## 2021-10-15 NOTE — Assessment & Plan Note (Signed)
Had an acute injury a few days ago.  Has a fairly large effusion that is likely related to his meniscal changes. -Counseled on home exercise therapy and supportive care. -Prednisone. -Could consider aspiration injection

## 2021-10-15 NOTE — Patient Instructions (Signed)
Nice to meet you ?Please try ice  ?Please try the exercises   ?Please send me a message in MyChart with any questions or updates.  ?Please see me back in 3 weeks.  ? ?--Dr. Takasha Vetere ? ?

## 2021-11-06 ENCOUNTER — Ambulatory Visit: Payer: BC Managed Care – PPO | Admitting: Family Medicine

## 2023-01-20 ENCOUNTER — Encounter: Payer: Self-pay | Admitting: *Deleted

## 2023-05-19 IMAGING — CR DG KNEE COMPLETE 4+V*L*
4 series · 4 of 4 positions shown · non-contrast
Comparison: None.

CLINICAL DATA: Left knee pain after fall yesterday.

EXAM:
LEFT KNEE - COMPLETE 4+ VIEW

[t knee ap left]
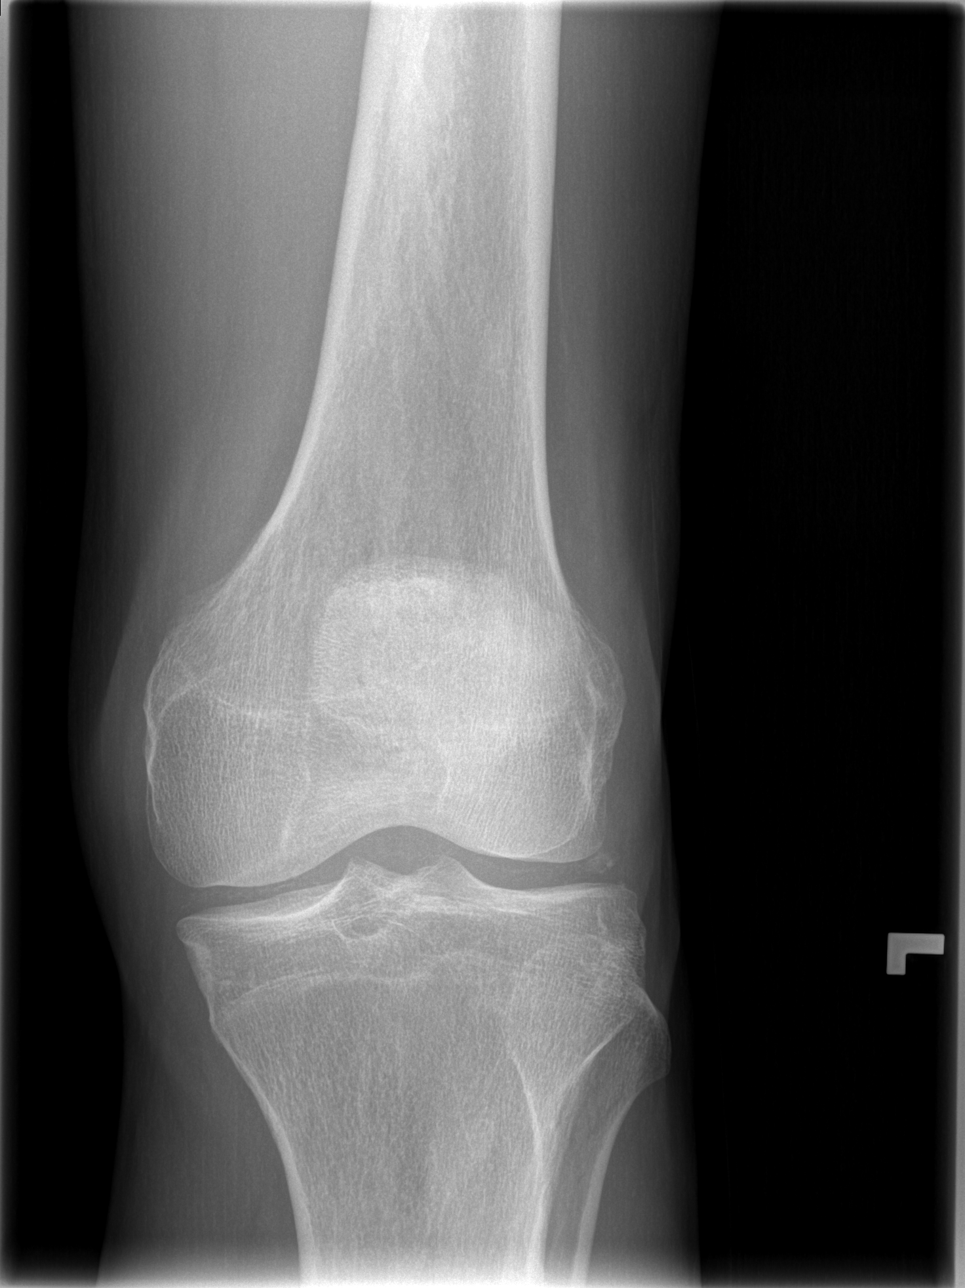

[t knee oblique left (1 of 2)]
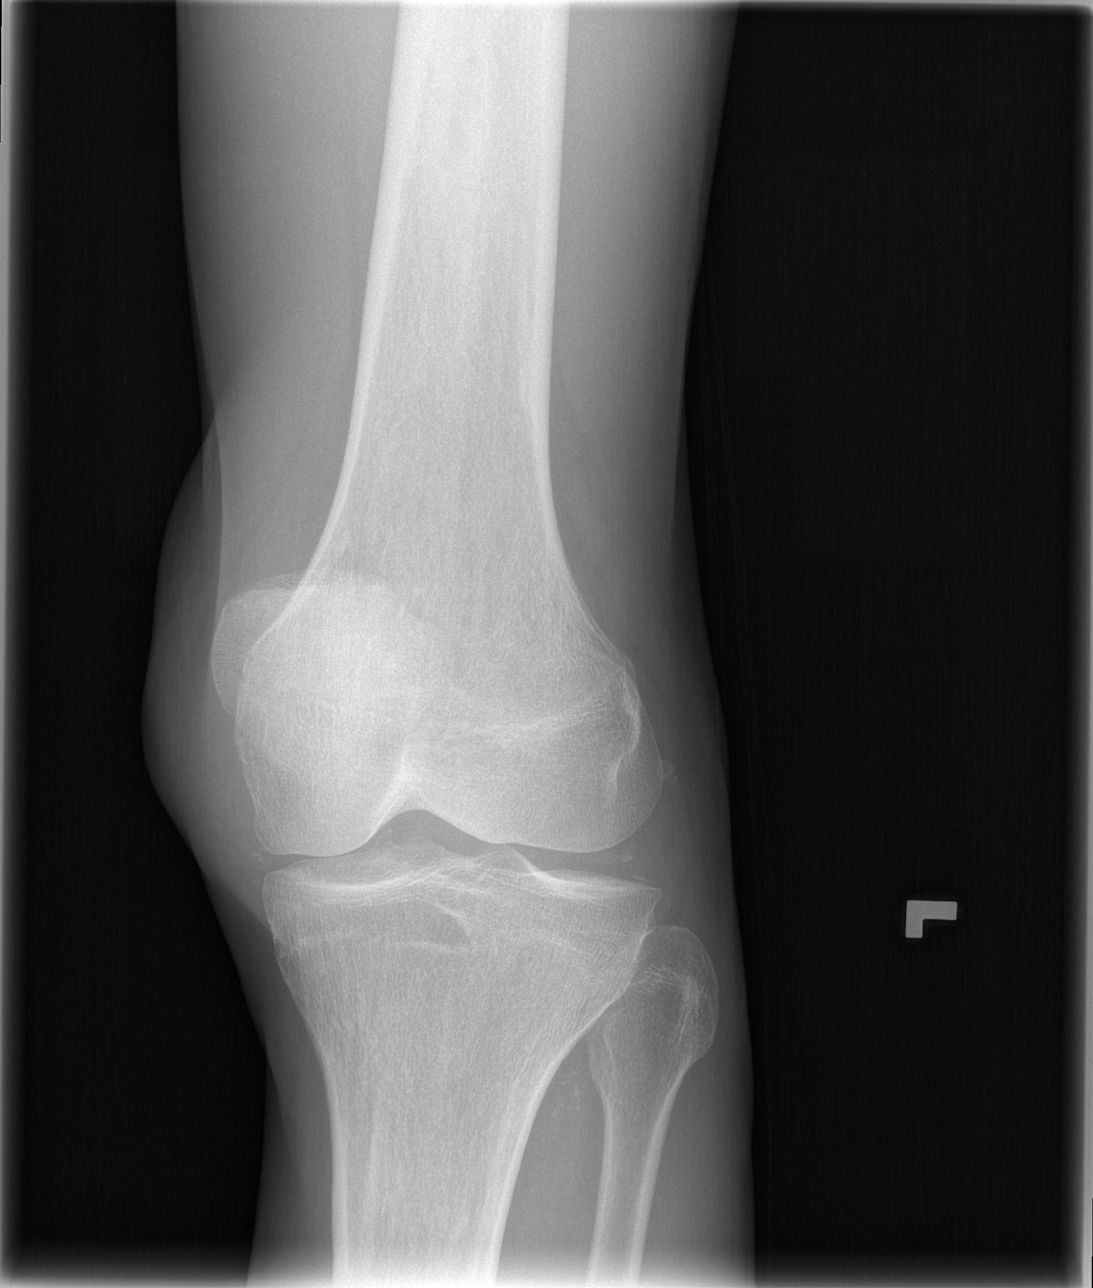

[t knee oblique left (2 of 2)]
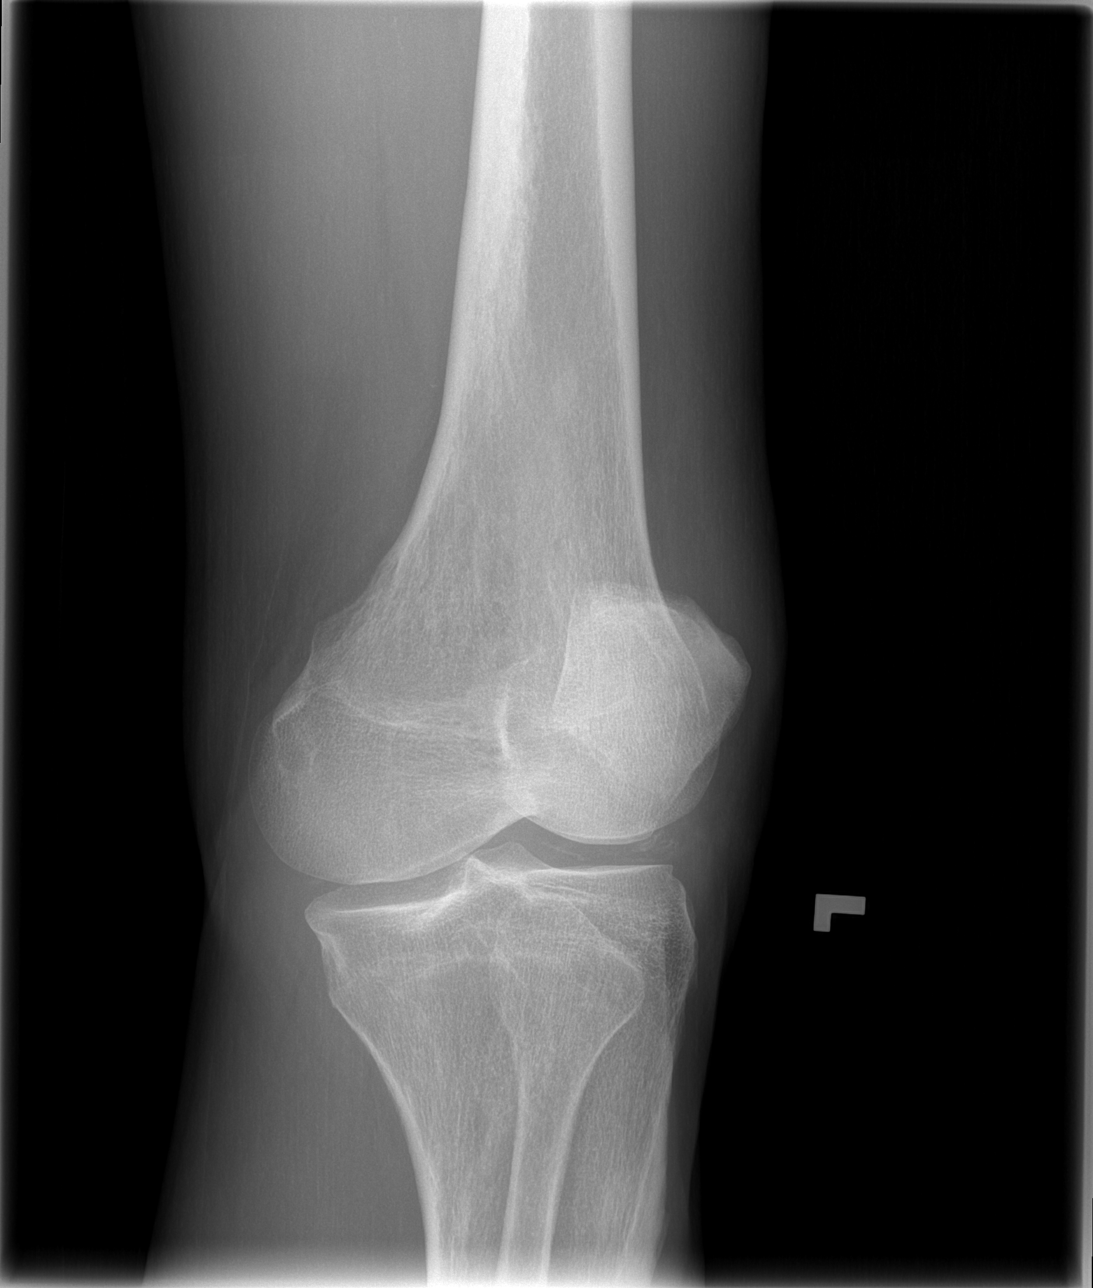

[t knee lat left]
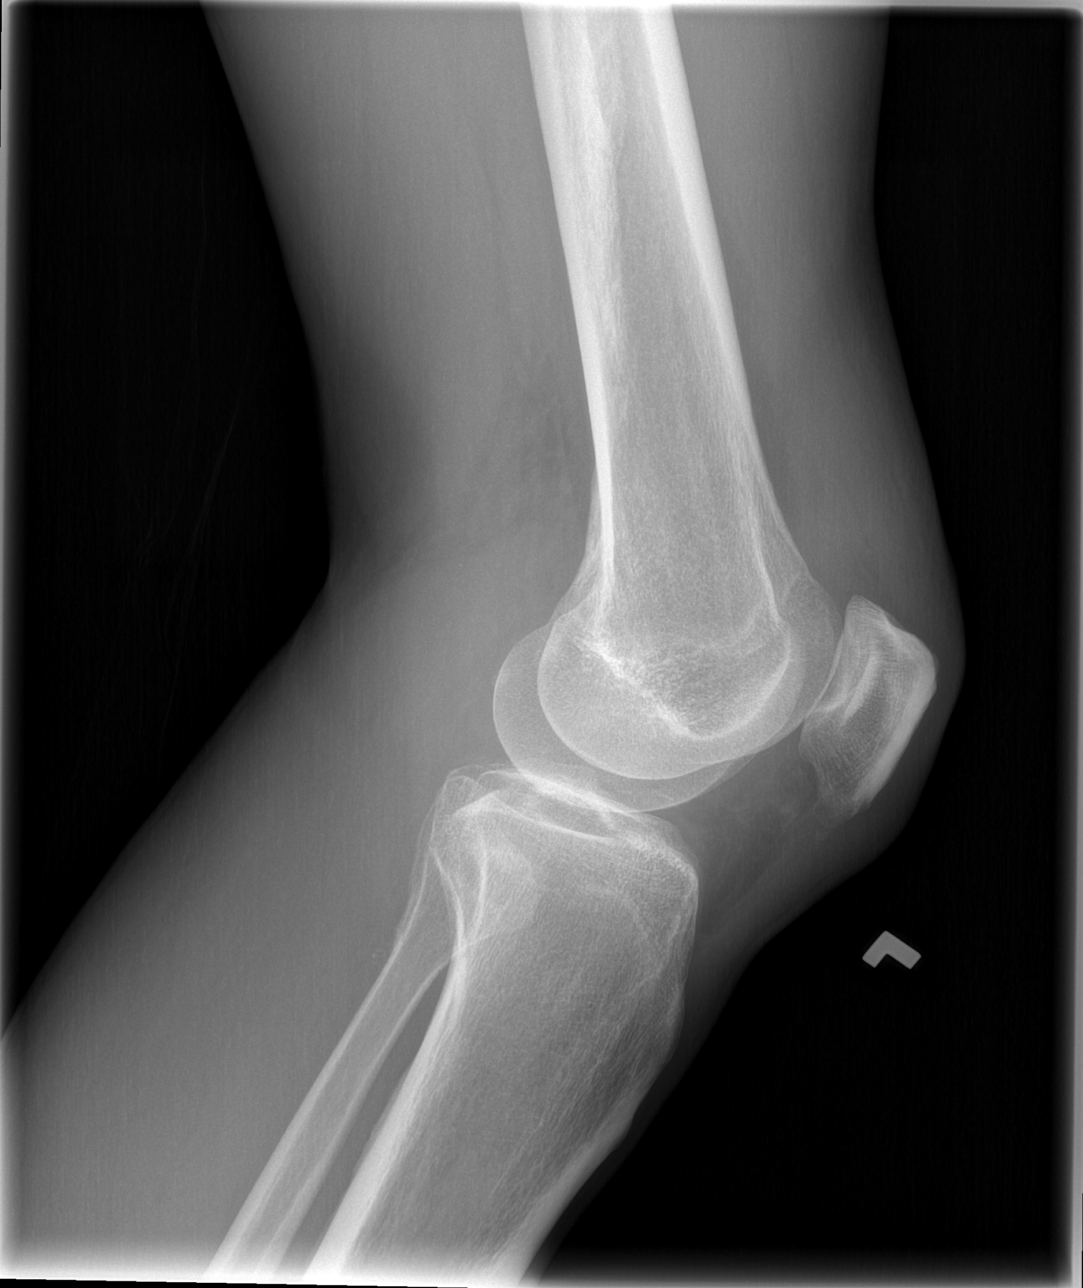

[4 of 4 positions shown; findings below may reference images not displayed]

FINDINGS: No evidence of fracture, dislocation, or joint effusion. No evidence
of arthropathy or other focal bone abnormality. Soft tissues are
unremarkable.
IMPRESSION: Negative.
